# Patient Record
Sex: Male | Born: 2000 | Race: White | Hispanic: No | Marital: Single | Smoking: Current some day smoker
Health system: Southern US, Community
[De-identification: ages and names within clinical notes are randomized; demographics above are authoritative.]

## PROBLEM LIST (undated history)

## (undated) DIAGNOSIS — E669 Obesity, unspecified: Secondary | ICD-10-CM

---
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
## 2019-03-18 ENCOUNTER — Telehealth: Payer: Self-pay

## 2019-03-18 NOTE — Telephone Encounter (Signed)
Called patient to do their pre-visit COVID screening.  Number on file is for the [facility]. No DPR on file, unable to call mom for appt reminder.

## 2019-03-19 ENCOUNTER — Ambulatory Visit: Payer: Medicaid Other

## 2019-04-16 IMAGING — DX DG CHEST 2V
2 series · 2 of 2 positions shown · non-contrast
Comparison: None.

CLINICAL DATA: Cough, chest pain and shortness of breath for 3
days.

EXAM:
CHEST - 2 VIEW

[chest pa]
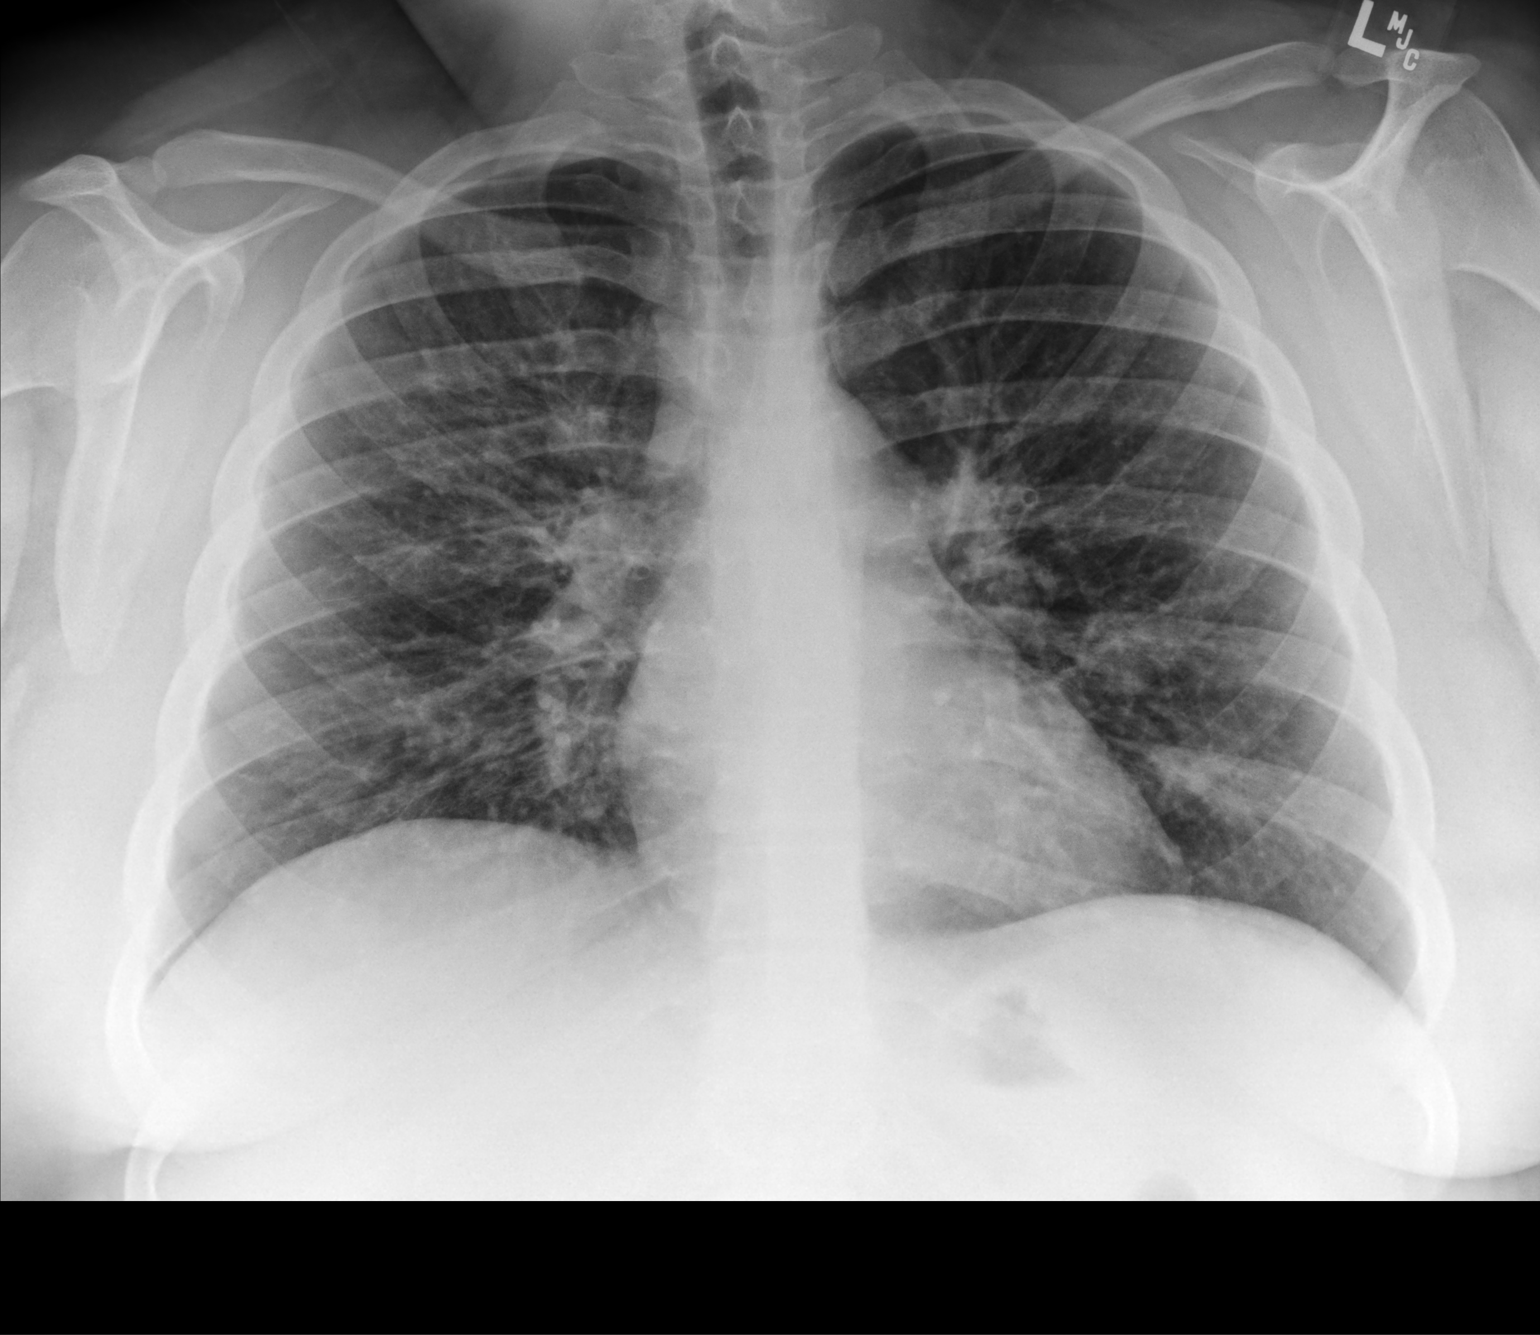

[chest lat]
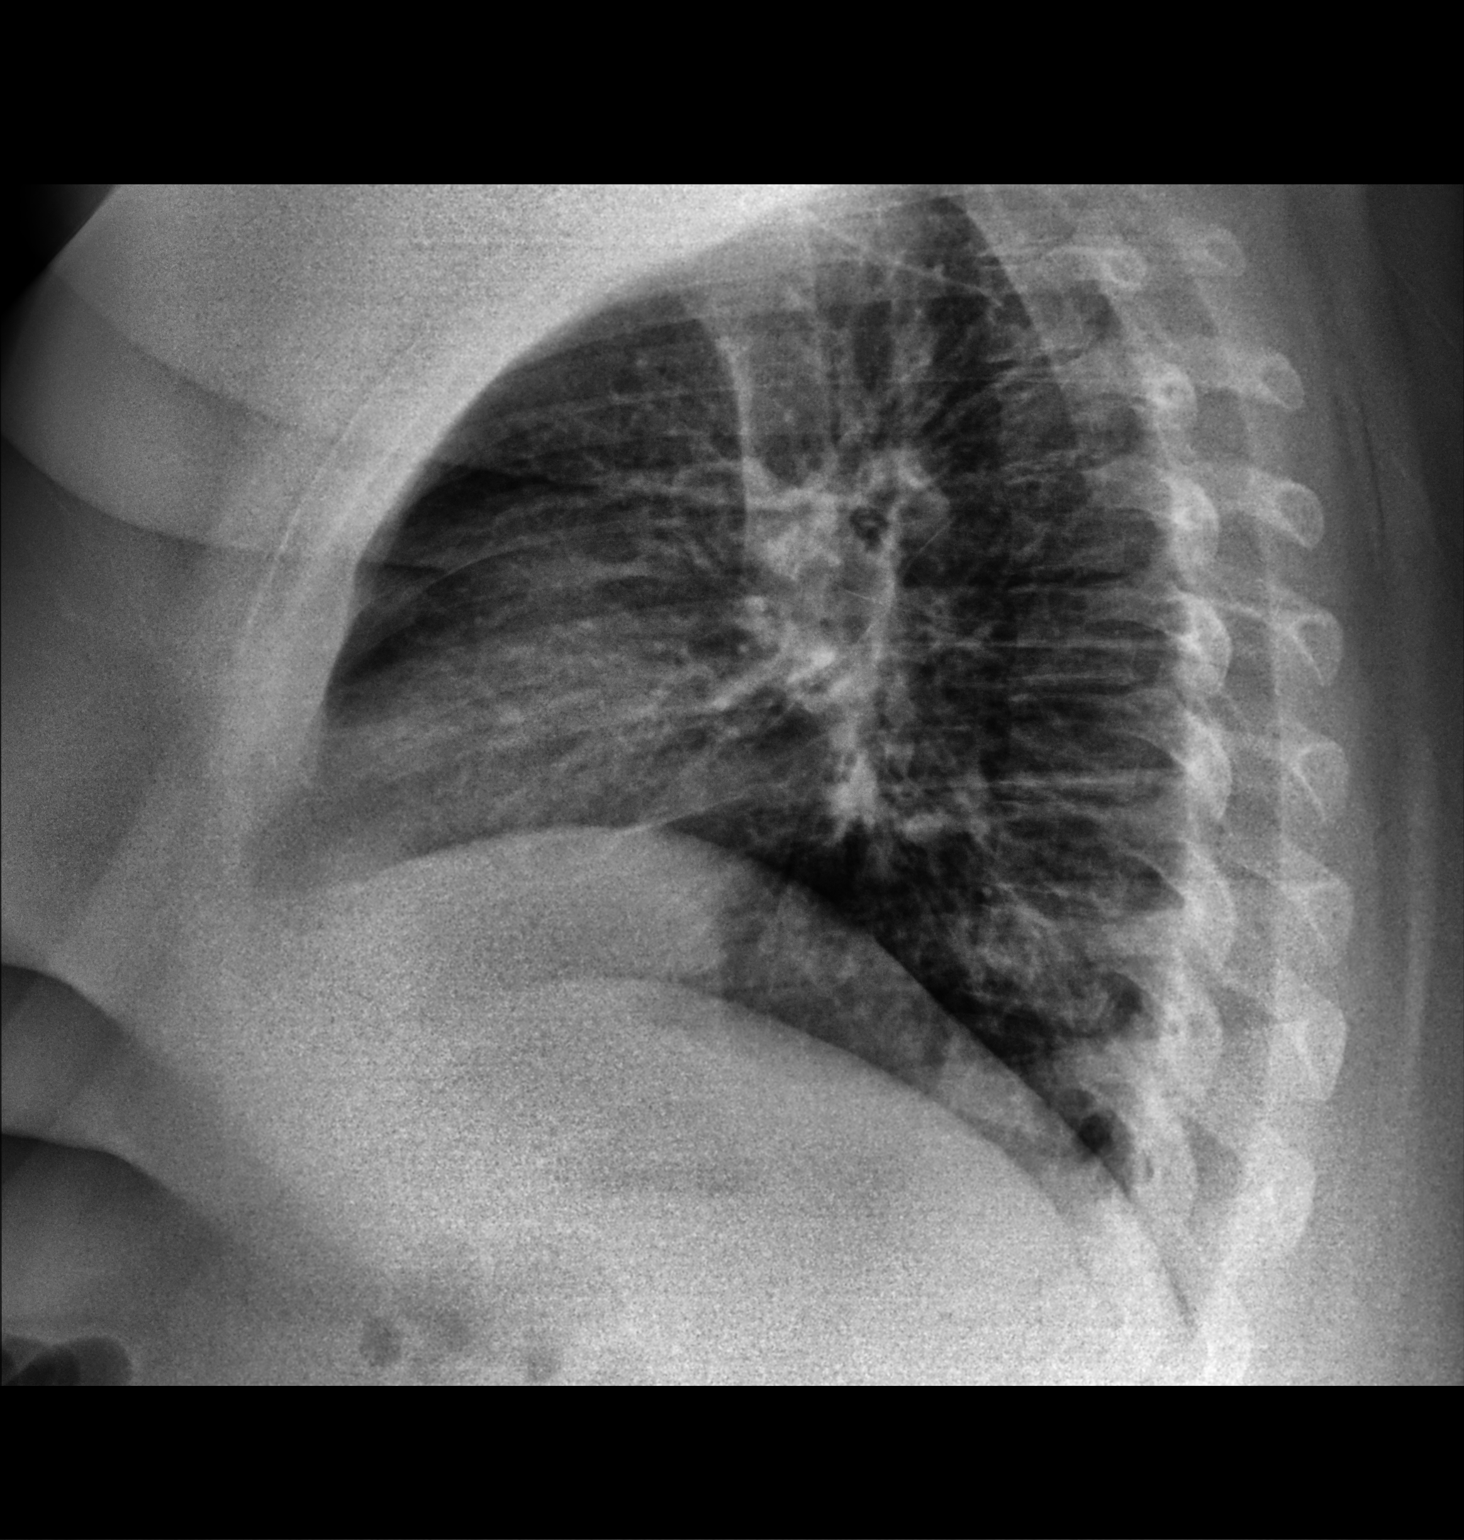

[2 of 2 positions shown; findings below may reference images not displayed]

FINDINGS: Peribronchial thickening is identified. No consolidative process,
pneumothorax or effusion. Heart size is normal. No acute or focal
bony abnormality.
IMPRESSION: Findings compatible with bronchitis.

## 2019-09-19 ENCOUNTER — Telehealth: Payer: Medicaid Other | Admitting: Internal Medicine

## 2020-05-05 ENCOUNTER — Telehealth: Payer: Medicaid Other | Admitting: Internal Medicine

## 2020-05-05 ENCOUNTER — Other Ambulatory Visit: Payer: Self-pay

## 2021-05-14 NOTE — Progress Notes (Signed)
Erroneous encounter

## 2021-05-18 ENCOUNTER — Encounter: Payer: Medicaid Other | Admitting: Family

## 2021-05-18 DIAGNOSIS — R7303 Prediabetes: Secondary | ICD-10-CM

## 2021-05-18 DIAGNOSIS — Z7689 Persons encountering health services in other specified circumstances: Secondary | ICD-10-CM

## 2021-06-07 ENCOUNTER — Ambulatory Visit (INDEPENDENT_AMBULATORY_CARE_PROVIDER_SITE_OTHER): Payer: Medicaid Other | Admitting: Nurse Practitioner

## 2021-06-07 ENCOUNTER — Encounter: Payer: Self-pay | Admitting: Nurse Practitioner

## 2021-06-07 ENCOUNTER — Other Ambulatory Visit: Payer: Self-pay

## 2021-06-07 VITALS — BP 164/95 | HR 95 | Temp 98.2°F | Ht 64.0 in | Wt >= 6400 oz

## 2021-06-07 DIAGNOSIS — L0211 Cutaneous abscess of neck: Secondary | ICD-10-CM | POA: Diagnosis not present

## 2021-06-07 MED ORDER — DOXYCYCLINE HYCLATE 100 MG PO TABS: 100.0000 mg | ORAL_TABLET | Freq: Two times a day (BID) | ORAL | 0 refills | Status: AC

## 2021-06-07 NOTE — Patient Instructions (Addendum)
1. Abscess, neck ? ?- CBC ?- Comprehensive metabolic panel ?- doxycycline (VIBRA-TABS) 100 MG tablet; Take 1 tablet (100 mg total) by mouth 2 (two) times daily for 10 days.  Dispense: 20 tablet; Refill: 0 ?- Ambulatory referral to Dermatology ? ?-defers physical at this time ? ?Elevated Blood pressure: ? ?Dash diet ? ?Follow up: ? ?Follow up if needed ? ?Mediterranean Diet ?A Mediterranean diet refers to food and lifestyle choices that are based on the traditions of countries located on the [employer] [place]. It focuses on eating more fruits, vegetables, whole grains, beans, nuts, seeds, and heart-healthy fats, and eating less dairy, meat, eggs, and processed foods with added sugar, salt, and fat. This way of eating has been shown to help prevent certain conditions and improve outcomes for people who have chronic diseases, like kidney disease and heart disease. ?What are tips for following this plan? ?Reading food labels ?Check the serving size of packaged foods. For foods such as rice and pasta, the serving size refers to the amount of cooked product, not dry. ?Check the total fat in packaged foods. Avoid foods that have saturated fat or trans fats. ?Check the ingredient list for added sugars, such as corn syrup. ?Shopping ? ?Buy a variety of foods that offer a balanced diet, including: ?Fresh fruits and vegetables (produce). ?Grains, beans, nuts, and seeds. Some of these may be available in unpackaged forms or large amounts (in bulk). ?Fresh seafood. ?Poultry and eggs. ?Low-fat dairy products. ?Buy whole ingredients instead of prepackaged foods. ?Buy fresh fruits and vegetables in-season from local farmers markets. ?Buy plain frozen fruits and vegetables. ?If you do not have access to quality fresh seafood, buy precooked frozen shrimp or canned fish, such as tuna, salmon, or sardines. ?Stock your pantry so you always have certain foods on hand, such as olive oil, canned tuna, canned tomatoes, rice, pasta, and  beans. ?Cooking ?Cook foods with extra-virgin olive oil instead of using butter or other vegetable oils. ?Have meat as a side dish, and have vegetables or grains as your main dish. This means having meat in small portions or adding small amounts of meat to foods like pasta or stew. ?Use beans or vegetables instead of meat in common dishes like chili or lasagna. ?Experiment with different cooking methods. Try roasting, broiling, steaming, and saut?ing vegetables. ?Add frozen vegetables to soups, stews, pasta, or rice. ?Add nuts or seeds for added healthy fats and plant protein at each meal. You can add these to yogurt, salads, or vegetable dishes. ?Marinate fish or vegetables using olive oil, lemon juice, garlic, and fresh herbs. ?Meal planning ?Plan to eat one vegetarian meal one day each week. Try to work up to two vegetarian meals, if possible. ?Eat seafood two or more times a week. ?Have healthy snacks readily available, such as: ?Vegetable sticks with hummus. ?[place] yogurt. ?Fruit and nut trail mix. ?Eat balanced meals throughout the week. This includes: ?Fruit: 2-3 servings a day. ?Vegetables: 4-5 servings a day. ?Low-fat dairy: 2 servings a day. ?Fish, poultry, or lean meat: 1 serving a day. ?Beans and legumes: 2 or more servings a week. ?Nuts and seeds: 1-2 servings a day. ?Whole grains: 6-8 servings a day. ?Extra-virgin olive oil: 3-4 servings a day. ?Limit red meat and sweets to only a few servings a month. ?Lifestyle ? ?Cook and eat meals together with your family, when possible. ?Drink enough fluid to keep your urine pale yellow. ?Be physically active every day. This includes: ?Aerobic exercise like running or swimming. ?  Leisure activities like gardening, walking, or housework. ?Get 7-8 hours of sleep each night. ?If recommended by your health care provider, drink red wine in moderation. This means 1 glass a day for nonpregnant women and 2 glasses a day for men. A glass of wine equals 5 oz (150 mL). ?What  foods should I eat? ?Fruits ?Apples. Apricots. Avocado. Berries. Bananas. Cherries. Dates. Figs. Grapes. Lemons. Melon. Oranges. Peaches. Plums. Pomegranate. ?Vegetables ?Artichokes. Beets. Broccoli. Cabbage. Carrots. Eggplant. Green beans. Chard. Kale. Spinach. Onions. Leeks. Peas. Squash. Tomatoes. Peppers. Radishes. ?Grains ?Whole-grain pasta. Brown rice. Bulgur wheat. Polenta. Couscous. Whole-wheat bread. [place] Cobbatmeal. Quinoa. ?Meats and other proteins ?Beans. Almonds. Sunflower seeds. Pine nuts. Peanuts. Cod. Salmon. Scallops. Shrimp. Tuna. Tilapia. Clams. Oysters. Eggs. Poultry without skin. ?Dairy ?Low-fat milk. Cheese. Greek yogurt. ?Fats and oils ?Extra-virgin olive oil. Avocado oil. Grapeseed oil. ?Beverages ?Water. Red wine. Herbal tea. ?Sweets and desserts ?Greek yogurt with honey. Baked apples. Poached pears. Trail mix. ?Seasonings and condiments ?Basil. Cilantro. Coriander. Cumin. Mint. Parsley. Sage. Rosemary. Tarragon. Garlic. Oregano. Thyme. Pepper. Balsamic vinegar. Tahini. Hummus. Tomato sauce. Olives. Mushrooms. ?The items listed above may not be a complete list of foods and beverages you can eat. Contact a dietitian for more information. ?What foods should I limit? ?This is a list of foods that should be eaten rarely or only on special occasions. ?Fruits ?Fruit canned in syrup. ?Vegetables ?Deep-fried potatoes (french fries). ?Grains ?Prepackaged pasta or rice dishes. Prepackaged cereal with added sugar. Prepackaged snacks with added sugar. ?Meats and other proteins ?Beef. Pork. Lamb. Poultry with skin. Hot dogs. Tomasa BlaseBacon. ?Dairy ?Ice cream. Sour cream. Whole milk. ?Fats and oils ?Butter. Canola oil. Vegetable oil. Beef fat (tallow). Lard. ?Beverages ?Juice. Sugar-sweetened soft drinks. Beer. Liquor and spirits. ?Sweets and desserts ?Cookies. Cakes. Pies. Candy. ?Seasonings and condiments ?Mayonnaise. Pre-made sauces and marinades. ?The items listed above may not be a complete list of foods and  beverages you should limit. Contact a dietitian for more information. ?Summary ?The Mediterranean diet includes both food and lifestyle choices. ?Eat a variety of fresh fruits and vegetables, beans, nuts, seeds, and whole grains. ?Limit the amount of red meat and sweets that you eat. ?If recommended by your health care provider, drink red wine in moderation. This means 1 glass a day for nonpregnant women and 2 glasses a day for men. A glass of wine equals 5 oz (150 mL). ?This information is not intended to replace advice given to you by your health care provider. Make sure you discuss any questions you have with your health care provider. ?Document Revised: 04/12/2019 Document Reviewed: 02/07/2019 ?Elsevier Patient Education ? 2022 Elsevier Inc. ? ? ?DASH Eating Plan ?DASH stands for Dietary Approaches to Stop Hypertension. The DASH eating plan is a healthy eating plan that has been shown to: ?Reduce high blood pressure (hypertension). ?Reduce your risk for type 2 diabetes, heart disease, and stroke. ?Help with weight loss. ?What are tips for following this plan? ?Reading food labels ?Check food labels for the amount of salt (sodium) per serving. Choose foods with less than 5 percent of the Daily Value of sodium. Generally, foods with less than 300 milligrams (mg) of sodium per serving fit into this eating plan. ?To find whole grains, look for the word "whole" as the first word in the ingredient list. ?Shopping ?Buy products labeled as "low-sodium" or "no salt added." ?Buy fresh foods. Avoid canned foods and pre-made or frozen meals. ?Cooking ?Avoid adding salt when cooking. Use salt-free seasonings or herbs instead of  table salt or sea salt. Check with your health care provider or pharmacist before using salt substitutes. ?Do not fry foods. Cook foods using healthy methods such as baking, boiling, grilling, roasting, and broiling instead. ?Cook with heart-healthy oils, such as olive, canola, avocado, soybean, or  sunflower oil. ?Meal planning ? ?Eat a balanced diet that includes: ?4 or more servings of fruits and 4 or more servings of vegetables each day. Try to fill one-half of your plate with fruits and vegetables. ?6-8

## 2021-06-07 NOTE — Progress Notes (Signed)
? ?@Patient  ID: Trevor Wilcox, male    DOB: [birth year], 21 y.o.   MRN: [id number] ? ?Chief Complaint  ?Patient presents with  ? New Patient (Initial Visit)  ?  Mass on back of neck for past 2 years. Has grown.   ? ? ?Referring provider: ?No ref. provider found ? ? ?HPI ? ?Patient presents today for raised area to the back of his neck.  He states that this has been going on for the past few weeks.  He has been having issues with these areas popping up today for parts of his body over the past 2 years.  He states that that.  Last visit started 2 weeks ago.  He has not noticed any drainage from this area.  He would like a referral to dermatology for further evaluation of these areas.  He states that the area is raised and tender to the touch. Denies f/c/s, n/v/d, hemoptysis, PND, chest pain or edema. ? ? ? ?Mass to neck getting larger ? ? ? ? ? ? ? ?No Known Allergies ? ? ?There is no immunization history on file for this patient. ? ?No past medical history on file. ? ?Tobacco History: ?Social History  ? ?Tobacco Use  ?Smoking Status Every Day  ?Smokeless Tobacco Never  ? ?Ready to quit: Not Answered ?Counseling given: Not Answered ? ? ?Outpatient Encounter Medications as of 06/07/2021  ?Medication Sig  ? doxycycline (VIBRA-TABS) 100 MG tablet Take 1 tablet (100 mg total) by mouth 2 (two) times daily for 10 days.  ? [DISCONTINUED] Melatonin 5 MG TABS Take 5-10 mg by mouth at bedtime as needed.  ? [DISCONTINUED] metFORMIN (GLUCOPHAGE) 500 MG tablet Take 1 tablet (500 mg total) by mouth 2 (two) times daily with a meal.  ? ?No facility-administered encounter medications on file as of 06/07/2021.  ? ? ? ?Review of Systems ? ?Review of Systems  ?Constitutional: Negative.   ?HENT: Negative.    ?Cardiovascular: Negative.   ?Gastrointestinal: Negative.   ?Skin:   ?     Raised tender area to the back of the neck  ?Allergic/Immunologic: Negative.   ?Neurological: Negative.   ?Psychiatric/Behavioral: Negative.     ? ? ? ?Physical  Exam ? ?BP (!) 164/95 (BP Location: Left Arm, Patient Position: Sitting, Cuff Size: Large)   Pulse 95   Temp 98.2 ?F (36.8 ?C) (Oral)   Ht 5\' 4"  (1.626 m)   Wt (!) 461 lb (209.1 kg)   SpO2 95%   BMI 79.13 kg/m?  ? ?Wt Readings from Last 5 Encounters:  ?06/07/21 (!) 461 lb (209.1 kg)  ?04/25/18 (!) 382 lb (173.3 kg) (>99 %, Z= 3.67)*  ?02/27/18 (!) 403 lb 12.8 oz (183.2 kg) (>99 %, Z= 3.79)*  ?02/20/18 (!) 404 lb 12.8 oz (183.6 kg) (>99 %, Z= 3.80)*  ?02/13/18 (!) 380 lb (172.4 kg) (>99 %, Z= 3.66)*  ? ?* Growth percentiles are based on CDC (Boys, 2-20 Years) data.  ? ? ? ?Physical Exam ?Vitals and nursing note reviewed.  ?Constitutional:   ?   General: He is not in acute distress. ?   Appearance: He is well-developed.  ?Cardiovascular:  ?   Rate and Rhythm: Normal rate and regular rhythm.  ?Pulmonary:  ?   Effort: Pulmonary effort is normal.  ?   Breath sounds: Normal breath sounds.  ?Skin: ?   General: Skin is warm and dry.  ?   Comments: Abscess noted to the left posterior side of neck. No drainage noted.  Tender to palpation.   ?Neurological:  ?   Mental Status: He is alert and oriented to person, place, and time.  ? ? ? ?Lab Results: ? ?CBC ?   ?Component Value Date/Time  ? WBC 14.2 (H) 06/07/2021 1525  ? RBC 5.40 06/07/2021 1525  ? HGB 15.7 06/07/2021 1525  ? HCT 47.5 06/07/2021 1525  ? PLT 302 06/07/2021 1525  ? MCV 88 06/07/2021 1525  ? MCH 29.1 06/07/2021 1525  ? MCHC 33.1 06/07/2021 1525  ? RDW 13.5 06/07/2021 1525  ? LYMPHSABS 3.6 (H) 02/20/2018 1110  ? EOSABS 0.4 02/20/2018 1110  ? BASOSABS 0.1 02/20/2018 1110  ? ? ?BMET ?   ?Component Value Date/Time  ? NA 141 06/07/2021 1525  ? K 4.7 06/07/2021 1525  ? CL 106 06/07/2021 1525  ? CO2 19 (L) 06/07/2021 1525  ? GLUCOSE 122 (H) 06/07/2021 1525  ? BUN 9 06/07/2021 1525  ? CREATININE 0.75 (L) 06/07/2021 1525  ? CALCIUM 9.5 06/07/2021 1525  ? GFRNONAA 145 02/20/2018 1110  ? GFRAA 167 02/20/2018 1110  ? ? ? ?Assessment & Plan:  ? ?Abscess, neck ?- CBC ?-  Comprehensive metabolic panel ?- doxycycline (VIBRA-TABS) 100 MG tablet; Take 1 tablet (100 mg total) by mouth 2 (two) times daily for 10 days.  Dispense: 20 tablet; Refill: 0 ?- Ambulatory referral to Dermatology ? ?-defers physical at this time ? ?Elevated Blood pressure: ? ?Dash diet ? ?Follow up: ? ?Follow up if needed ? ?Patient Instructions  ?1. Abscess, neck ? ?- CBC ?- Comprehensive metabolic panel ?- doxycycline (VIBRA-TABS) 100 MG tablet; Take 1 tablet (100 mg total) by mouth 2 (two) times daily for 10 days.  Dispense: 20 tablet; Refill: 0 ?- Ambulatory referral to Dermatology ? ?-defers physical at this time ? ?Elevated Blood pressure: ? ?Dash diet ? ?Follow up: ? ?Follow up if needed ? ?Mediterranean Diet ?A Mediterranean diet refers to food and lifestyle choices that are based on the traditions of countries located on the [place]. It focuses on eating more fruits, vegetables, whole grains, beans, nuts, seeds, and heart-healthy fats, and eating less dairy, meat, eggs, and processed foods with added sugar, salt, and fat. This way of eating has been shown to help prevent certain conditions and improve outcomes for people who have chronic diseases, like kidney disease and heart disease. ?What are tips for following this plan? ?Reading food labels ?Check the serving size of packaged foods. For foods such as rice and pasta, the serving size refers to the amount of cooked product, not dry. ?Check the total fat in packaged foods. Avoid foods that have saturated fat or trans fats. ?Check the ingredient list for added sugars, such as corn syrup. ?Shopping ? ?Buy a variety of foods that offer a balanced diet, including: ?Fresh fruits and vegetables (produce). ?Grains, beans, nuts, and seeds. Some of these may be available in unpackaged forms or large amounts (in bulk). ?Fresh seafood. ?Poultry and eggs. ?Low-fat dairy products. ?Buy whole ingredients instead of prepackaged foods. ?Buy fresh fruits and  vegetables in-season from local farmers markets. ?Buy plain frozen fruits and vegetables. ?If you do not have access to quality fresh seafood, buy precooked frozen shrimp or canned fish, such as tuna, salmon, or sardines. ?Stock your pantry so you always have certain foods on hand, such as olive oil, canned tuna, canned tomatoes, rice, pasta, and beans. ?Cooking ?Cook foods with extra-virgin olive oil instead of using butter or other vegetable oils. ?Have meat as a  side dish, and have vegetables or grains as your main dish. This means having meat in small portions or adding small amounts of meat to foods like pasta or stew. ?Use beans or vegetables instead of meat in common dishes like chili or lasagna. ?Experiment with different cooking methods. Try roasting, broiling, steaming, and saut?ing vegetables. ?Add frozen vegetables to soups, stews, pasta, or rice. ?Add nuts or seeds for added healthy fats and plant protein at each meal. You can add these to yogurt, salads, or vegetable dishes. ?Marinate fish or vegetables using olive oil, lemon juice, garlic, and fresh herbs. ?Meal planning ?Plan to eat one vegetarian meal one day each week. Try to work up to two vegetarian meals, if possible. ?Eat seafood two or more times a week. ?Have healthy snacks readily available, such as: ?Vegetable sticks with hummus. ?[place] yogurt. ?Fruit and nut trail mix. ?Eat balanced meals throughout the week. This includes: ?Fruit: 2-3 servings a day. ?Vegetables: 4-5 servings a day. ?Low-fat dairy: 2 servings a day. ?Fish, poultry, or lean meat: 1 serving a day. ?Beans and legumes: 2 or more servings a week. ?Nuts and seeds: 1-2 servings a day. ?Whole grains: 6-8 servings a day. ?Extra-virgin olive oil: 3-4 servings a day. ?Limit red meat and sweets to only a few servings a month. ?Lifestyle ? ?Cook and eat meals together with your family, when possible. ?Drink enough fluid to keep your urine pale yellow. ?Be physically active every day.  This includes: ?Aerobic exercise like running or swimming. ?Leisure activities like gardening, walking, or housework. ?Get 7-8 hours of sleep each night. ?If recommended by your health care provider, drink red win

## 2021-06-08 ENCOUNTER — Encounter: Payer: Self-pay | Admitting: Nurse Practitioner

## 2021-06-08 DIAGNOSIS — L0211 Cutaneous abscess of neck: Secondary | ICD-10-CM | POA: Insufficient documentation

## 2021-06-08 LAB — CBC
Hematocrit: 47.5 % (ref 37.5–51.0)
Hemoglobin: 15.7 g/dL (ref 13.0–17.7)
MCH: 29.1 pg (ref 26.6–33.0)
MCHC: 33.1 g/dL (ref 31.5–35.7)
MCV: 88 fL (ref 79–97)
Platelets: 302 10*3/uL (ref 150–450)
RBC: 5.4 x10E6/uL (ref 4.14–5.80)
RDW: 13.5 % (ref 11.6–15.4)
WBC: 14.2 10*3/uL — ABNORMAL HIGH (ref 3.4–10.8)

## 2021-06-08 LAB — COMPREHENSIVE METABOLIC PANEL
ALT: 30 IU/L (ref 0–44)
AST: 14 IU/L (ref 0–40)
Albumin/Globulin Ratio: 1.2 (ref 1.2–2.2)
Albumin: 4.3 g/dL (ref 4.1–5.2)
Alkaline Phosphatase: 104 IU/L (ref 44–121)
BUN/Creatinine Ratio: 12 (ref 9–20)
BUN: 9 mg/dL (ref 6–20)
Bilirubin Total: <0.2 mg/dL (ref 0.0–1.2)
CO2: 19 mmol/L — ABNORMAL LOW (ref 20–29)
Calcium: 9.5 mg/dL (ref 8.7–10.2)
Chloride: 106 mmol/L (ref 96–106)
Creatinine, Ser: 0.75 mg/dL — ABNORMAL LOW (ref 0.76–1.27)
Globulin, Total: 3.6 g/dL (ref 1.5–4.5)
Glucose: 122 mg/dL — ABNORMAL HIGH (ref 70–99)
Potassium: 4.7 mmol/L (ref 3.5–5.2)
Sodium: 141 mmol/L (ref 134–144)
Total Protein: 7.9 g/dL (ref 6.0–8.5)
eGFR: 132 mL/min/{1.73_m2} (ref >59)

## 2021-06-08 NOTE — Assessment & Plan Note (Signed)
-   CBC ?- Comprehensive metabolic panel ?- doxycycline (VIBRA-TABS) 100 MG tablet; Take 1 tablet (100 mg total) by mouth 2 (two) times daily for 10 days.  Dispense: 20 tablet; Refill: 0 ?- Ambulatory referral to Dermatology ? ?-defers physical at this time ? ?Elevated Blood pressure: ? ?Dash diet ? ?Follow up: ? ?Follow up if needed ?

## 2021-07-11 ENCOUNTER — Ambulatory Visit (HOSPITAL_COMMUNITY)
Admission: EM | Admit: 2021-07-11 | Discharge: 2021-07-11 | Disposition: A | Discharge status: Home / Self Care | Payer: Medicaid Other | Attending: Emergency Medicine | Admitting: Emergency Medicine

## 2021-07-11 ENCOUNTER — Ambulatory Visit (INDEPENDENT_AMBULATORY_CARE_PROVIDER_SITE_OTHER): Payer: Medicaid Other

## 2021-07-11 ENCOUNTER — Encounter (HOSPITAL_COMMUNITY): Payer: Self-pay | Admitting: *Deleted

## 2021-07-11 DIAGNOSIS — R0600 Dyspnea, unspecified: Secondary | ICD-10-CM

## 2021-07-11 DIAGNOSIS — R0602 Shortness of breath: Secondary | ICD-10-CM | POA: Diagnosis not present

## 2021-07-11 DIAGNOSIS — R0789 Other chest pain: Secondary | ICD-10-CM

## 2021-07-11 HISTORY — DX: Obesity, unspecified: E66.9

## 2021-07-11 MED ORDER — ALBUTEROL SULFATE HFA 108 (90 BASE) MCG/ACT IN AERS: 1.0000 | INHALATION_SPRAY | Freq: Four times a day (QID) | RESPIRATORY_TRACT | 0 refills | Status: AC | PRN

## 2021-07-11 NOTE — Discharge Instructions (Addendum)
Follow with PCP for further referral for CT scan ?ProAir was prescribed/take as directed ?Chest pain precautions given. ?Return or go to ER if you develop any new or worsening of your symptoms ?

## 2021-07-11 NOTE — ED Triage Notes (Signed)
C/O dyspnea x 4 days. States called EMS - eas told elevated BP, but told EMS he would go to urgent care. ?Upon further triaging pt, he then states he's been having constant mid-chest pain. ?States SOB and chest pain is worse when laying down. C/O feeling fatigue. Denies any cough or congestion. ?Also notes "a line of knots to right side of my neck"; started off as 1 knot 2 yrs ago, "but now there are more wrapping around". ?

## 2021-07-11 NOTE — ED Provider Notes (Signed)
?[place] ? ? ?[id number] ?07/11/21 Arrival Time: 6761 ? ? ?Chief Complaint  ?Patient presents with  ? Shortness of Breath  ? ? ?SUBJECTIVE: ? ?Trevor Wilcox is a 21 y.o. male who presented to the urgent care with a complaint of shortness of breath and chest pain for the past 4 days.  Denies a precipitating event, trauma, recent lower respiratory tract, or strenuous upper body activities.  Localizes chest pain to the substernal region with radiating pain to the right arm.  Describes pain as achy and reproducible when pushing on his chest. denies any alleviating or aggravating factors.  Denies similar symptoms in the past.  Denies fever, chills, lightheadedness, dizziness, palpitations, tachycardia, SOB, nausea, vomiting, abdominal pain, changes in bowel or bladder habits, diaphoresis, numbness/tingling in extremities, peripheral edema, or anxiety.   ? ? ?Denies close relatives with cardiac hx ? ?Previous cardiac testing: none. ? ?ROS: As per HPI.  All other pertinent ROS negative.    ?Past Medical History:  ?Diagnosis Date  ? Obesity   ? ?History reviewed. No pertinent surgical history. ?No Known Allergies ?No current facility-administered medications on file prior to encounter.  ? ?No current outpatient medications on file prior to encounter.  ? ?Social History  ? ?Socioeconomic History  ? Marital status: Single  ?  Spouse name: Not on file  ? Number of children: Not on file  ? Years of education: Not on file  ? Highest education level: Not on file  ?Occupational History  ? Not on file  ?Tobacco Use  ? Smoking status: Some Days  ?  Types: Cigarettes  ? Smokeless tobacco: Never  ?Vaping Use  ? Vaping Use: Never used  ?Substance and Sexual Activity  ? Alcohol use: Not Currently  ?  Comment: rarely  ? Drug use: Yes  ?  Types: Marijuana  ? Sexual activity: Not on file  ?Other Topics Concern  ? Not on file  ?Social History Narrative  ? Not on file  ? ?Social Determinants of Health  ? ?Financial Resource  Strain: Not on file  ?Food Insecurity: Not on file  ?Transportation Needs: Not on file  ?Physical Activity: Not on file  ?Stress: Not on file  ?Social Connections: Not on file  ?Intimate Partner Violence: Not on file  ? ?Family History  ?Problem Relation Age of Onset  ? Rheum arthritis Mother   ? Hypertension Mother   ? Obesity Mother   ? ? ? ?OBJECTIVE: ? ?Vitals:  ? 07/11/21 1529 07/11/21 1535  ?BP:  (!) 145/74  ?Pulse: 90   ?Resp:  20  ?Temp:  98.1 ?F (36.7 ?C)  ?TempSrc:  Oral  ?SpO2: 98%   ?  ?General appearance: alert; no distress ?Eyes: PERRLA; EOMI; conjunctiva normal ?HENT: normocephalic; atraumatic ?Neck: supple ?Lungs: clear to auscultation bilaterally without adventitious breath sounds ?Heart: regular rate and rhythm.  Clear S1 and S2 without rubs, gallops, or murmur. ?Chest Wall: Pain with palpation of chest wall,  no thrills ?Abdomen: soft, non-tender; bowel sounds normal; no masses or organomegaly; no guarding or rebound tenderness ?Extremities: no cyanosis or edema; symmetrical with no gross deformities ?Skin: warm and dry ?Psychological: alert and cooperative; normal mood and affect ? ?ECG: ?Orders placed or performed during the hospital encounter of 07/11/21  ? ED EKG  ? ED EKG  ? EKG 12-Lead  ? EKG 12-Lead  ? ? ?EKG normal sinus rhythm without ST elevations, depressions, or prolonged PR interval.  No narrowing or widening of the QRS complexes.  I have personally review the EKG. ? ?LABS:  ?Results for orders placed or performed in visit on 06/07/21  ?CBC  ?Result Value Ref Range  ? WBC 14.2 (H) 3.4 - 10.8 x10E3/uL  ? RBC 5.40 4.14 - 5.80 x10E6/uL  ? Hemoglobin 15.7 13.0 - 17.7 g/dL  ? Hematocrit 47.5 37.5 - 51.0 %  ? MCV 88 79 - 97 fL  ? MCH 29.1 26.6 - 33.0 pg  ? MCHC 33.1 31.5 - 35.7 g/dL  ? RDW 13.5 11.6 - 15.4 %  ? Platelets 302 150 - 450 x10E3/uL  ?Comprehensive metabolic panel  ?Result Value Ref Range  ? Glucose 122 (H) 70 - 99 mg/dL  ? BUN 9 6 - 20 mg/dL  ? Creatinine, Ser 0.75 (L) 0.76 -  1.27 mg/dL  ? eGFR 132 >59 mL/min/1.73  ? BUN/Creatinine Ratio 12 9 - 20  ? Sodium 141 134 - 144 mmol/L  ? Potassium 4.7 3.5 - 5.2 mmol/L  ? Chloride 106 96 - 106 mmol/L  ? CO2 19 (L) 20 - 29 mmol/L  ? Calcium 9.5 8.7 - 10.2 mg/dL  ? Total Protein 7.9 6.0 - 8.5 g/dL  ? Albumin 4.3 4.1 - 5.2 g/dL  ? Globulin, Total 3.6 1.5 - 4.5 g/dL  ? Albumin/Globulin Ratio 1.2 1.2 - 2.2  ? Bilirubin Total <0.2 0.0 - 1.2 mg/dL  ? Alkaline Phosphatase 104 44 - 121 IU/L  ? AST 14 0 - 40 IU/L  ? ALT 30 0 - 44 IU/L  ? ?Labs Reviewed - No data to display ? ?DIAGNOSTIC STUDIES:  ? ?DG Chest 2 View ? ?Result Date: 07/11/2021 ?CLINICAL DATA:  Dyspnea for 4 days. EXAM: CHEST - 2 VIEW COMPARISON:  May 16, 2018 FINDINGS: The heart size is normal. There is questioned masslike opacity projected over the right hilum. The left hilum is normal. Both lungs are clear. The visualized skeletal structures are unremarkable. IMPRESSION: Question masslike opacity projected over the right hilum. Further evaluation with chest CT is recommended. Electronically Signed   By: Abelardo Diesel M.D.   On: 07/11/2021 16:45    ? ?Chest x-ray is questionable for masslike opacity.  Further evaluation with CT is recommended.  I have reviewed the x-ray myself and the radiologist interpretation.  I am in agreement with the radiologist interpretation. ? ? ?ASSESSMENT & PLAN: ? ?1. SOB (shortness of breath)   ?2. Chest wall pain   ? ? ?Meds ordered this encounter  ?Medications  ? albuterol (VENTOLIN HFA) 108 (90 Base) MCG/ACT inhaler  ?  Sig: Inhale 1-2 puffs into the lungs every 6 (six) hours as needed for wheezing or shortness of breath.  ?  Dispense:  18 g  ?  Refill:  0  ? ? ?Patient history and exam consistent with non-cardiac cause of chest pain. ?Conservative measures indicated. ? ?Discharge instructions ? ?Follow with PCP for further referral for CT scan ?ProAir was prescribed/take as directed ?Chest pain precautions given. ?Return or go to ER if you develop any  new or worsening of your symptoms ? ?Reviewed expectations re: course of current medical issues. Questions answered. ?Outlined signs and symptoms indicating need for more acute intervention. ?Patient verbalized understanding. ?After Visit Summary given.  ?  ?Emerson Monte, FNP ?07/11/21 1714 ? ?

## 2021-07-13 ENCOUNTER — Other Ambulatory Visit: Payer: Self-pay

## 2021-07-13 ENCOUNTER — Ambulatory Visit: Payer: Self-pay

## 2021-07-13 ENCOUNTER — Emergency Department (HOSPITAL_COMMUNITY): Payer: Medicaid Other

## 2021-07-13 ENCOUNTER — Telehealth: Payer: Self-pay | Admitting: Nurse Practitioner

## 2021-07-13 ENCOUNTER — Encounter (HOSPITAL_COMMUNITY): Payer: Self-pay | Admitting: Emergency Medicine

## 2021-07-13 ENCOUNTER — Emergency Department (HOSPITAL_COMMUNITY)
Admission: EM | Admit: 2021-07-13 | Discharge: 2021-07-13 | Disposition: A | Discharge status: Home / Self Care | Payer: Medicaid Other | Attending: Emergency Medicine | Admitting: Emergency Medicine

## 2021-07-13 DIAGNOSIS — R0602 Shortness of breath: Secondary | ICD-10-CM | POA: Insufficient documentation

## 2021-07-13 DIAGNOSIS — R079 Chest pain, unspecified: Secondary | ICD-10-CM | POA: Diagnosis not present

## 2021-07-13 DIAGNOSIS — R918 Other nonspecific abnormal finding of lung field: Secondary | ICD-10-CM | POA: Diagnosis not present

## 2021-07-13 LAB — CBC WITH DIFFERENTIAL/PLATELET
Abs Immature Granulocytes: 0.05 10*3/uL (ref 0.00–0.07)
Basophils Absolute: 0 10*3/uL (ref 0.0–0.1)
Basophils Relative: 0 %
Eosinophils Absolute: 0.4 10*3/uL (ref 0.0–0.5)
Eosinophils Relative: 3 %
HCT: 43.5 % (ref 39.0–52.0)
Hemoglobin: 14.2 g/dL (ref 13.0–17.0)
Immature Granulocytes: 0 %
Lymphocytes Relative: 28 %
Lymphs Abs: 4.2 10*3/uL — ABNORMAL HIGH (ref 0.7–4.0)
MCH: 29.3 pg (ref 26.0–34.0)
MCHC: 32.6 g/dL (ref 30.0–36.0)
MCV: 89.7 fL (ref 80.0–100.0)
Monocytes Absolute: 1 10*3/uL (ref 0.1–1.0)
Monocytes Relative: 7 %
Neutro Abs: 9.3 10*3/uL — ABNORMAL HIGH (ref 1.7–7.7)
Neutrophils Relative %: 62 %
Platelets: 275 10*3/uL (ref 150–400)
RBC: 4.85 MIL/uL (ref 4.22–5.81)
RDW: 14.1 % (ref 11.5–15.5)
WBC: 15 10*3/uL — ABNORMAL HIGH (ref 4.0–10.5)
nRBC: 0 % (ref 0.0–0.2)

## 2021-07-13 LAB — BASIC METABOLIC PANEL
Anion gap: 6 (ref 5–15)
BUN: 11 mg/dL (ref 6–20)
CO2: 25 mmol/L (ref 22–32)
Calcium: 9.2 mg/dL (ref 8.9–10.3)
Chloride: 106 mmol/L (ref 98–111)
Creatinine, Ser: 0.73 mg/dL (ref 0.61–1.24)
GFR, Estimated: >60 mL/min (ref >60)
Glucose, Bld: 93 mg/dL (ref 70–99)
Potassium: 3.9 mmol/L (ref 3.5–5.1)
Sodium: 137 mmol/L (ref 135–145)

## 2021-07-13 LAB — BRAIN NATRIURETIC PEPTIDE: B Natriuretic Peptide: 31.7 pg/mL (ref 0.0–100.0)

## 2021-07-13 LAB — TROPONIN I (HIGH SENSITIVITY): Troponin I (High Sensitivity): 4 ng/L (ref <18)

## 2021-07-13 MED ORDER — IOHEXOL 350 MG/ML SOLN
100.0000 mL | Freq: Once | INTRAVENOUS | Status: AC | PRN
  Administered 2021-07-13: 100 mL via INTRAVENOUS

## 2021-07-13 NOTE — ED Notes (Signed)
Pt transported to CT ?

## 2021-07-13 NOTE — ED Notes (Signed)
Pt ambulatory to restroom

## 2021-07-13 NOTE — ED Provider Notes (Signed)
?[facility] ?Provider Note ? ? ?CSN: [id number] ?Arrival date & time: 07/13/21  1425 ? ?  ? ?History ? ?Chief Complaint  ?Patient presents with  ? Shortness of Breath  ? ? ?Algis GreenhouseOmar Wilcox is a 21 y.o. male. ? ?Patient is a 21 year old male who presents with shortness of breath.  He has a history of obesity.  He states for the last 5 days he has had some shortness of breath.  He says it is worse when he lays flat but he also has some shortness of breath during the day.  No cough or cold symptoms.  No fevers.  He does have some pain to the center of his chest and to the right side.  Its not worse with cough or breathing.  No increased leg swelling.  No history of similar symptoms in the past.  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  He was seen in urgent care 2 days ago for the symptoms and started on albuterol inhaler which has not seemed to be helping.  He also had a chest x-ray at that time which showed a questionable mass in the right hilum.  He is very anxious about that and came to the emergency room to have it evaluated further. ? ? ?  ? ?Home Medications ?Prior to Admission medications   ?Medication Sig Start Date End Date Taking? Authorizing Provider  ?albuterol (VENTOLIN HFA) 108 (90 Base) MCG/ACT inhaler Inhale 1-2 puffs into the lungs every 6 (six) hours as needed for wheezing or shortness of breath. 07/11/21  Yes Avegno, Zachery DakinsKomlanvi S, FNP  ?   ? ?Allergies    ?Patient has no known allergies.   ? ?Review of Systems   ?Review of Systems  ?Constitutional:  Negative for chills, diaphoresis, fatigue and fever.  ?HENT:  Negative for congestion, rhinorrhea and sneezing.   ?Eyes: Negative.   ?Respiratory:  Positive for shortness of breath. Negative for cough and chest tightness.   ?Cardiovascular:  Positive for chest pain. Negative for leg swelling.  ?Gastrointestinal:  Negative for abdominal pain, blood in stool, diarrhea, nausea and vomiting.   ?Genitourinary:  Negative for difficulty urinating, flank pain, frequency and hematuria.  ?Musculoskeletal:  Negative for arthralgias and back pain.  ?Skin:  Negative for rash.  ?Neurological:  Negative for dizziness, speech difficulty, weakness, numbness and headaches.  ? ?Physical Exam ?Updated Vital Signs ?BP 133/71   Pulse 72   Temp 97.6 ?F (36.4 ?C) (Oral)   Resp 14   Ht 5\' 6"  (1.676 m)   Wt (!) 195 kg   SpO2 96%   BMI 69.40 kg/m?  ?Physical Exam ?Constitutional:   ?   Appearance: He is well-developed. He is obese.  ?HENT:  ?   Head: Normocephalic and atraumatic.  ?Eyes:  ?   Pupils: Pupils are equal, round, and reactive to light.  ?Cardiovascular:  ?   Rate and Rhythm: Normal rate and regular rhythm.  ?   Heart sounds: Normal heart sounds.  ?Pulmonary:  ?   Effort: Pulmonary effort is normal. No respiratory distress.  ?   Breath sounds: Normal breath sounds. No wheezing or rales.  ?Chest:  ?   Chest wall: No tenderness.  ?Abdominal:  ?   General: Bowel sounds are normal.  ?   Palpations: Abdomen is soft.  ?   Tenderness: There is no abdominal tenderness. There is no guarding or rebound.  ?Musculoskeletal:     ?  General: Normal range of motion.  ?   Cervical back: Normal range of motion and neck supple.  ?   Right lower leg: No edema.  ?   Left lower leg: No edema.  ?Lymphadenopathy:  ?   Cervical: No cervical adenopathy.  ?Skin: ?   General: Skin is warm and dry.  ?   Findings: No rash.  ?Neurological:  ?   Mental Status: He is alert and oriented to person, place, and time.  ? ? ?ED Results / Procedures / Treatments   ?Labs ?(all labs ordered are listed, but only abnormal results are displayed) ?Labs Reviewed  ?CBC WITH DIFFERENTIAL/PLATELET - Abnormal; Notable for the following components:  ?    Result Value  ? WBC 15.0 (*)   ? Neutro Abs 9.3 (*)   ? Lymphs Abs 4.2 (*)   ? All other components within normal limits  ?BASIC METABOLIC PANEL  ?BRAIN NATRIURETIC PEPTIDE  ?TROPONIN I (HIGH SENSITIVITY)   ? ? ?EKG ?EKG Interpretation ? ?Date/Time:  Tuesday July 13 2021 16:17:47 EDT ?Ventricular Rate:  90 ?PR Interval:  162 ?QRS Duration: 114 ?QT Interval:  352 ?QTC Calculation: 431 ?R Axis:   9 ?Text Interpretation: Sinus rhythm Borderline intraventricular conduction delay since last tracing no significant change Confirmed by Rolan Bucco [phone]) on 07/13/2021 4:19:48 PM ? ?Radiology ?CT Angio Chest PE W/Cm &/Or Wo Cm ? ?Result Date: 07/13/2021 ?CLINICAL DATA:  Pulmonary embolism suspected, high probability. Shortness of breath, possible right hilar mass. EXAM: CT ANGIOGRAPHY CHEST WITH CONTRAST TECHNIQUE: Multidetector CT imaging of the chest was performed using the standard protocol during bolus administration of intravenous contrast. Multiplanar CT image reconstructions and MIPs were obtained to evaluate the vascular anatomy. RADIATION DOSE REDUCTION: This exam was performed according to the departmental dose-optimization program which includes automated exposure control, adjustment of the mA and/or kV according to patient size and/or use of iterative reconstruction technique. CONTRAST:  OMNIPAQUE IOHEXOL 350 MG/ML SOLN COMPARISON:  11/10/2021. FINDINGS: Cardiovascular: The heart is normal in size and there is no pericardial effusion. The aorta and pulmonary trunk are normal in caliber. No large central pulmonary artery filling defect is identified. Evaluation of the segmental and subsegmental arteries is limited due to suboptimal opacification and respiratory motion artifact. Mediastinum/Nodes: A few prominent lymph nodes are seen in the mediastinum measuring up to 1.5 cm in short axis diameter in the right paratracheal space. There is a mass like lesion at the right hilum measuring 3.9 x 3.2 cm. No hilar lymph adenopathy on the left. No axillary lymphadenopathy. The thyroid gland, trachea, and esophagus are within normal limits. Lungs/Pleura: Lungs are clear. No pleural effusion or pneumothorax. Upper  Abdomen: There is fatty infiltration of the liver. The spleen is enlarged measuring 14.1 cm. Musculoskeletal: Gynecomastia is present bilaterally. No acute or suspicious osseous abnormality. Review of the MIP images confirms the above findings. IMPRESSION: 1. No large central pulmonary artery filling defect. Evaluation of the segmental and subsegmental arteries is limited due to suboptimal opacification and respiratory motion artifact. If clinical concern for pulmonary embolism persists, short-term repeat evaluation or V/Q scan is suggested. 2. No acute process in the chest. 3. Masslike lesion at the right hilum measuring 3.9 x 3.2 cm. Differential diagnosis includes conglomeration of enlarged lymph nodes versus other benign or malignant process. PET-CT or bronchoscopy with biopsy may be beneficial for further evaluation. 4. Hepatomegaly. 5. Mild splenomegaly. Electronically Signed   By: Thornell Sartorius M.D.   On: 07/13/2021 20:51   ? ?  Procedures ?Procedures  ? ? ?Medications Ordered in ED ?Medications  ?iohexol (OMNIPAQUE) 350 MG/ML injection 100 mL (100 mLs Intravenous Contrast Given 07/13/21 2031)  ? ? ?ED Course/ Medical Decision Making/ A&P ?  ?                        ?Medical Decision Making ?Amount and/or Complexity of Data Reviewed ?External Data Reviewed: notes. ?Labs: ordered. Decision-making details documented in ED Course. ?Radiology: ordered. Decision-making details documented in ED Course. ?ECG/medicine tests: ordered and independent interpretation performed. Decision-making details documented in ED Course. ? ?Risk ?Decision regarding hospitalization. ? ? ?Patient is a 21 year old male who presents with shortness of breath.  He is morbidly obese.  He is not febrile.  No suggestions of pneumonia.  He had a recent x-ray at urgent care which showed a right lung mass.  CT scan was performed today which shows no evidence of PE.  There is a right lung mass that needs further outpatient follow-up.  His BMP is  normal without any suggestions of pulmonary edema.  He has no hypoxia.  No tachycardia.  No increased work of breathing.  He is otherwise well-appearing.  No chest pain or other symptoms that would be more suggestive of ACS.

## 2021-07-13 NOTE — Telephone Encounter (Signed)
Pt's mother called in for pt. She is not listed on pt's DPR so limited information was given. She states that EMS was called out for pt the other morning and pt ended up going to UC for SOB. UC did CXR and recommended CT be done d/t mass over hylum. Pt's mother asking what pt should do and if CT can be done in office and saying that pt is still SOB and lethargic. I advised mother that if pt still experiencing those symptoms would need to go to ED to be evaluated and they can do additional testing there if needed. She asked if CT can be done in the office and I advised her no that either has to be done in the hospital or outpatient imaging that is scheduled. She verbalized understanding and states she would tell pt he needed to go to ED.  ?

## 2021-07-13 NOTE — ED Triage Notes (Signed)
Patient states he went to UC on 4/23 and was told to come here for CT scan. Reports dyspnea x 5 days. Had X-ray at [facility] that impression says "Question mass like opacity projected over the right hilum." Was given inhaler and states symptoms have improved.  ?

## 2021-07-13 NOTE — ED Notes (Signed)
Got patient into a gown on the monitor patient is resting with call bell in reach 

## 2021-07-13 NOTE — Discharge Instructions (Signed)
Make an appointment within the next few days to follow-up with your primary care doctor.  You need to have further testing done on the abnormal looking area in your lung.  Return to the emergency room if you have any worsening symptoms or increased shortness of breath. ?

## 2021-07-13 NOTE — ED Provider Triage Note (Signed)
Emergency Medicine Provider Triage Evaluation Note ? ?Trevor Wilcox , a 21 y.o. male  was evaluated in triage.  Pt complains of shortness of breath for the last 5 days.  Patient was seen in urgent care on 4/23, had chest x-ray done which showed "masslike opacity projected over the right hilum.  Further evaluation with CT chest recommended".  Patient states that since being seen at urgent care, he was provided with inhaler which did seem to alleviate some of his shortness of breath.  Patient states that he is "a hypochondriac so I am kind of nervous".  Patient reports increased shortness of breath beginning last night.  Also endorsing chest pain. ? ?Review of Systems  ?Positive:  ?Negative:  ? ?Physical Exam  ?BP (!) 181/89   Pulse 98   Temp 98.8 ?F (37.1 ?C) (Oral)   Resp (!) 22   Ht 5\' 6"  (1.676 m)   Wt (!) 195 kg   SpO2 100%   BMI 69.40 kg/m?  ?Gen:   Awake, no distress   ?Resp:  Normal effort  ?MSK:   Moves extremities without difficulty  ?Other:   ? ?Medical Decision Making  ?Medically screening exam initiated at 2:53 PM.  Appropriate orders placed.  Elan Brainerd was informed that the remainder of the evaluation will be completed by another provider, this initial triage assessment does not replace that evaluation, and the importance of remaining in the ED until their evaluation is complete. ? ? ?  ?Trevor Greenhouse, PA-C ?07/13/21 1454 ? ?

## 2021-07-13 NOTE — ED Notes (Signed)
Pt brought back from CT - IV apparently infiltrated but previous RN not made aware of this. CT called to verify that pt did not receive exam and asked to let RN know next time something like this happens  ?

## 2021-07-13 NOTE — ED Notes (Signed)
Patient verbalizes understanding of discharge instructions. Opportunity for questioning and answers were provided. Armband removed by staff, pt discharged from ED ambulatory.   

## 2021-07-13 NOTE — Telephone Encounter (Signed)
Copied from [place] [id number]. Topic: Quick Communication - See Telephone Encounter ?>> Jul 13, 2021  9:45 AM Loma [place] wrote: ?CRM for notification. See Telephone encounter for: 07/13/21.Pt has not been given abnormal lab results and no MyChart or CRM to disclose, pls FU with PT [phone] ?

## 2021-07-14 ENCOUNTER — Other Ambulatory Visit: Payer: Self-pay | Admitting: Nurse Practitioner

## 2021-07-14 ENCOUNTER — Telehealth: Payer: Self-pay

## 2021-07-14 DIAGNOSIS — R918 Other nonspecific abnormal finding of lung field: Secondary | ICD-10-CM

## 2021-07-14 NOTE — Telephone Encounter (Signed)
Please call patient to let him know that I am placing stat referral to pulmonary for further evaluation of chest CT yesterday. Please call [facility] to see how quick they can get him scheduled. Thanks.  ?

## 2021-07-14 NOTE — Telephone Encounter (Signed)
I have documented this within the referral. ?

## 2021-07-15 ENCOUNTER — Telehealth: Payer: Self-pay

## 2021-07-15 NOTE — Telephone Encounter (Signed)
I called patient per provider to let him know that state referral to pulmonary for further evaluation of chest CT yesterday.  no answer left message to call office ?

## 2021-07-30 NOTE — Telephone Encounter (Signed)
No additional notes needed  

## 2022-02-14 NOTE — Progress Notes (Unsigned)
Patient ID: Trevor Wilcox, male    DOB: 02-14-2001  MRN: [id number]  CC: Annual Physical Exam  Subjective: Trevor Wilcox is a 21 y.o. male who presents for annual physical exam.  His concerns today include:   referral for dentist, weight issues, physical  Patient Active Problem List   Diagnosis Date Noted   Abscess, neck 06/08/2021   Prediabetes 03/01/2018   Folliculitis 02/20/2018   BMI 60.0-69.9, adult (HCC) 02/20/2018     Current Outpatient Medications on File Prior to Visit  Medication Sig Dispense Refill   albuterol (VENTOLIN HFA) 108 (90 Base) MCG/ACT inhaler Inhale 1-2 puffs into the lungs every 6 (six) hours as needed for wheezing or shortness of breath. 18 g 0   No current facility-administered medications on file prior to visit.    No Known Allergies  Social History   Socioeconomic History   Marital status: Single    Spouse name: Not on file   Number of children: Not on file   Years of education: Not on file   Highest education level: Not on file  Occupational History   Not on file  Tobacco Use   Smoking status: Some Days    Types: Cigarettes   Smokeless tobacco: Never  Vaping Use   Vaping Use: Never used  Substance and Sexual Activity   Alcohol use: Not Currently    Comment: rarely   Drug use: Yes    Types: Marijuana   Sexual activity: Not on file  Other Topics Concern   Not on file  Social History Narrative   Not on file   Social Determinants of Health   Financial Resource Strain: Not on file  Food Insecurity: Not on file  Transportation Needs: Not on file  Physical Activity: Not on file  Stress: Not on file  Social Connections: Not on file  Intimate Partner Violence: Not on file    Family History  Problem Relation Age of Onset   Rheum arthritis Mother    Hypertension Mother    Obesity Mother     No past surgical history on file.  ROS: Review of Systems Negative except as stated above  PHYSICAL EXAM: There were no vitals  taken for this visit.  Physical Exam  {male adult master:310786} {male adult master:310785}     Latest Ref Rng & Units 07/13/2021    2:55 PM 06/07/2021    3:25 PM 02/20/2018   11:10 AM  CMP  Glucose 70 - 99 mg/dL 93  409  811   BUN 6 - 20 mg/dL 11  9  9    Creatinine 0.61 - 1.24 mg/dL 9.14  7.82  9.56   Sodium 135 - 145 mmol/L 137  141  139   Potassium 3.5 - 5.1 mmol/L 3.9  4.7  4.5   Chloride 98 - 111 mmol/L 106  106  101   CO2 22 - 32 mmol/L 25  19  22    Calcium 8.9 - 10.3 mg/dL 9.2  9.5  9.6   Total Protein 6.0 - 8.5 g/dL  7.9  7.6   Total Bilirubin 0.0 - 1.2 mg/dL  <2.1  0.3   Alkaline Phos 44 - 121 IU/L  104  97   AST 0 - 40 IU/L  14  16   ALT 0 - 44 IU/L  30  23    Lipid Panel     Component Value Date/Time   CHOL 155 02/20/2018 1110   TRIG 57 02/20/2018 1110   HDL  42 02/20/2018 1110   CHOLHDL 3.7 02/20/2018 1110   LDLCALC 102 02/20/2018 1110    CBC    Component Value Date/Time   WBC 15.0 (H) 07/13/2021 1455   RBC 4.85 07/13/2021 1455   HGB 14.2 07/13/2021 1455   HGB 15.7 06/07/2021 1525   HCT 43.5 07/13/2021 1455   HCT 47.5 06/07/2021 1525   PLT 275 07/13/2021 1455   PLT 302 06/07/2021 1525   MCV 89.7 07/13/2021 1455   MCV 88 06/07/2021 1525   MCH 29.3 07/13/2021 1455   MCHC 32.6 07/13/2021 1455   RDW 14.1 07/13/2021 1455   RDW 13.5 06/07/2021 1525   LYMPHSABS 4.2 (H) 07/13/2021 1455   LYMPHSABS 3.6 (H) 02/20/2018 1110   MONOABS 1.0 07/13/2021 1455   EOSABS 0.4 07/13/2021 1455   EOSABS 0.4 02/20/2018 1110   BASOSABS 0.0 07/13/2021 1455   BASOSABS 0.1 02/20/2018 1110    ASSESSMENT AND PLAN:  There are no diagnoses linked to this encounter.   Patient was given the opportunity to ask questions.  Patient verbalized understanding of the plan and was able to repeat key elements of the plan. Patient was given clear instructions to go to Emergency Department or return to medical center if symptoms don't improve, worsen, or new problems develop.The  patient verbalized understanding.   No orders of the defined types were placed in this encounter.    Requested Prescriptions    No prescriptions requested or ordered in this encounter    No follow-ups on file.  Rema Fendt, NP

## 2022-02-16 ENCOUNTER — Encounter: Payer: Medicaid Other | Admitting: Family

## 2022-02-16 DIAGNOSIS — Z1329 Encounter for screening for other suspected endocrine disorder: Secondary | ICD-10-CM

## 2022-02-16 DIAGNOSIS — Z13220 Encounter for screening for lipoid disorders: Secondary | ICD-10-CM

## 2022-02-16 DIAGNOSIS — Z114 Encounter for screening for human immunodeficiency virus [HIV]: Secondary | ICD-10-CM

## 2022-02-16 DIAGNOSIS — Z130 Encounter for screening for diseases of the blood and blood-forming organs and certain disorders involving the immune mechanism: Secondary | ICD-10-CM

## 2022-02-16 DIAGNOSIS — Z13228 Encounter for screening for other metabolic disorders: Secondary | ICD-10-CM

## 2022-02-16 DIAGNOSIS — Z1159 Encounter for screening for other viral diseases: Secondary | ICD-10-CM

## 2022-02-16 DIAGNOSIS — R7303 Prediabetes: Secondary | ICD-10-CM

## 2022-02-16 DIAGNOSIS — Z0000 Encounter for general adult medical examination without abnormal findings: Secondary | ICD-10-CM

## 2022-03-04 NOTE — Progress Notes (Unsigned)
Patient ID: Trevor Wilcox, male    DOB: 10/02/2000  MRN: [id number]  CC: Annual Physical Exam  Subjective: Trevor Wilcox is a 21 y.o. male who presents for annual physical exam.   His concerns today include:   referral for dentist, weight issues, physical  Patient Active Problem List   Diagnosis Date Noted   Abscess, neck 06/08/2021   Prediabetes 03/01/2018   Folliculitis 02/20/2018   BMI 60.0-69.9, adult (HCC) 02/20/2018     Current Outpatient Medications on File Prior to Visit  Medication Sig Dispense Refill   albuterol (VENTOLIN HFA) 108 (90 Base) MCG/ACT inhaler Inhale 1-2 puffs into the lungs every 6 (six) hours as needed for wheezing or shortness of breath. 18 g 0   No current facility-administered medications on file prior to visit.    No Known Allergies  Social History   Socioeconomic History   Marital status: Single    Spouse name: Not on file   Number of children: Not on file   Years of education: Not on file   Highest education level: Not on file  Occupational History   Not on file  Tobacco Use   Smoking status: Some Days    Types: Cigarettes   Smokeless tobacco: Never  Vaping Use   Vaping Use: Never used  Substance and Sexual Activity   Alcohol use: Not Currently    Comment: rarely   Drug use: Yes    Types: Marijuana   Sexual activity: Not on file  Other Topics Concern   Not on file  Social History Narrative   Not on file   Social Determinants of Health   Financial Resource Strain: Not on file  Food Insecurity: Not on file  Transportation Needs: Not on file  Physical Activity: Not on file  Stress: Not on file  Social Connections: Not on file  Intimate Partner Violence: Not on file    Family History  Problem Relation Age of Onset   Rheum arthritis Mother    Hypertension Mother    Obesity Mother     No past surgical history on file.  ROS: Review of Systems Negative except as stated above  PHYSICAL EXAM: There were no vitals  taken for this visit.  Physical Exam  {male adult master:310786} {male adult master:310785}     Latest Ref Rng & Units 07/13/2021    2:55 PM 06/07/2021    3:25 PM 02/20/2018   11:10 AM  CMP  Glucose 70 - 99 mg/dL 93  409  811   BUN 6 - 20 mg/dL 11  9  9    Creatinine 0.61 - 1.24 mg/dL 9.14  7.82  9.56   Sodium 135 - 145 mmol/L 137  141  139   Potassium 3.5 - 5.1 mmol/L 3.9  4.7  4.5   Chloride 98 - 111 mmol/L 106  106  101   CO2 22 - 32 mmol/L 25  19  22    Calcium 8.9 - 10.3 mg/dL 9.2  9.5  9.6   Total Protein 6.0 - 8.5 g/dL  7.9  7.6   Total Bilirubin 0.0 - 1.2 mg/dL  <2.1  0.3   Alkaline Phos 44 - 121 IU/L  104  97   AST 0 - 40 IU/L  14  16   ALT 0 - 44 IU/L  30  23    Lipid Panel     Component Value Date/Time   CHOL 155 02/20/2018 1110   TRIG 57 02/20/2018 1110  HDL 42 02/20/2018 1110   CHOLHDL 3.7 02/20/2018 1110   LDLCALC 102 02/20/2018 1110    CBC    Component Value Date/Time   WBC 15.0 (H) 07/13/2021 1455   RBC 4.85 07/13/2021 1455   HGB 14.2 07/13/2021 1455   HGB 15.7 06/07/2021 1525   HCT 43.5 07/13/2021 1455   HCT 47.5 06/07/2021 1525   PLT 275 07/13/2021 1455   PLT 302 06/07/2021 1525   MCV 89.7 07/13/2021 1455   MCV 88 06/07/2021 1525   MCH 29.3 07/13/2021 1455   MCHC 32.6 07/13/2021 1455   RDW 14.1 07/13/2021 1455   RDW 13.5 06/07/2021 1525   LYMPHSABS 4.2 (H) 07/13/2021 1455   LYMPHSABS 3.6 (H) 02/20/2018 1110   MONOABS 1.0 07/13/2021 1455   EOSABS 0.4 07/13/2021 1455   EOSABS 0.4 02/20/2018 1110   BASOSABS 0.0 07/13/2021 1455   BASOSABS 0.1 02/20/2018 1110    ASSESSMENT AND PLAN:  There are no diagnoses linked to this encounter.   Patient was given the opportunity to ask questions.  Patient verbalized understanding of the plan and was able to repeat key elements of the plan. Patient was given clear instructions to go to Emergency Department or return to medical center if symptoms don't improve, worsen, or new problems develop.The  patient verbalized understanding.   No orders of the defined types were placed in this encounter.    Requested Prescriptions    No prescriptions requested or ordered in this encounter    No follow-ups on file.  Rema Fendt, NP

## 2022-03-08 ENCOUNTER — Encounter: Payer: Medicaid Other | Admitting: Family

## 2022-03-08 ENCOUNTER — Ambulatory Visit: Payer: Medicaid Other | Admitting: Family

## 2022-03-08 DIAGNOSIS — Z131 Encounter for screening for diabetes mellitus: Secondary | ICD-10-CM

## 2022-03-08 DIAGNOSIS — Z0000 Encounter for general adult medical examination without abnormal findings: Secondary | ICD-10-CM

## 2022-03-08 DIAGNOSIS — Z13228 Encounter for screening for other metabolic disorders: Secondary | ICD-10-CM

## 2022-03-08 DIAGNOSIS — Z1159 Encounter for screening for other viral diseases: Secondary | ICD-10-CM

## 2022-03-08 DIAGNOSIS — Z1329 Encounter for screening for other suspected endocrine disorder: Secondary | ICD-10-CM

## 2022-03-08 DIAGNOSIS — Z114 Encounter for screening for human immunodeficiency virus [HIV]: Secondary | ICD-10-CM

## 2022-03-08 DIAGNOSIS — Z130 Encounter for screening for diseases of the blood and blood-forming organs and certain disorders involving the immune mechanism: Secondary | ICD-10-CM

## 2022-03-08 DIAGNOSIS — Z13220 Encounter for screening for lipoid disorders: Secondary | ICD-10-CM

## 2022-06-13 IMAGING — CT CT ANGIO CHEST
2 of 7 series · 17 of 46 positions shown · IV contrast (APPLIED)
Comparison: 11/10/2021.

CLINICAL DATA: Pulmonary embolism suspected, high probability.
Shortness of breath, possible right hilar mass.

EXAM:
CT ANGIOGRAPHY CHEST WITH CONTRAST
TECHNIQUE: Multidetector CT imaging of the chest was performed using the
standard protocol during bolus administration of intravenous
contrast. Multiplanar CT image reconstructions and MIPs were
obtained to evaluate the vascular anatomy.

[Series 7: thins · axial · 0.98mm/px · z∈[+1262,+1524]mm · 15 of 294 slices shown]
[im 16/294  lung]
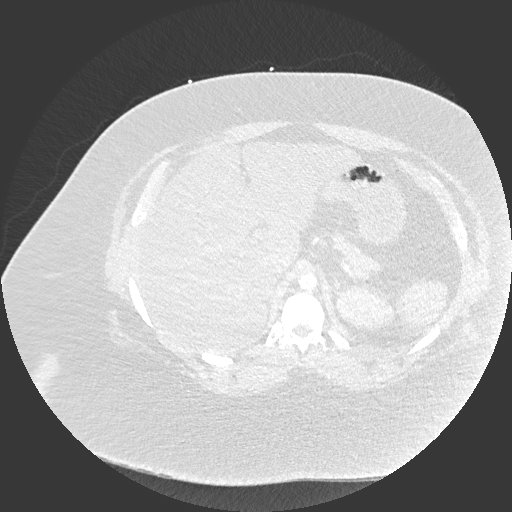
[im 31/294  soft-tissue]
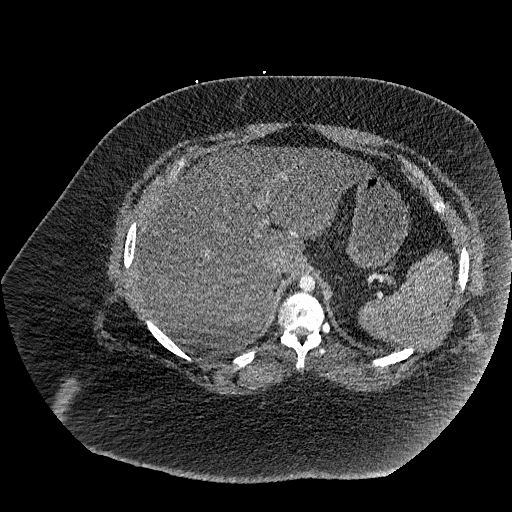
[im 62/294  lung]
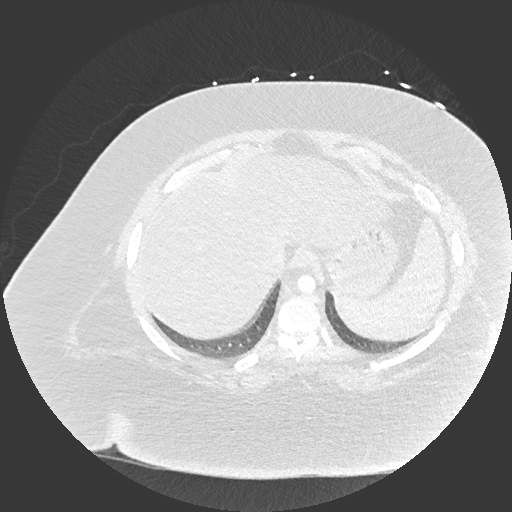
[im 78/294  soft-tissue]
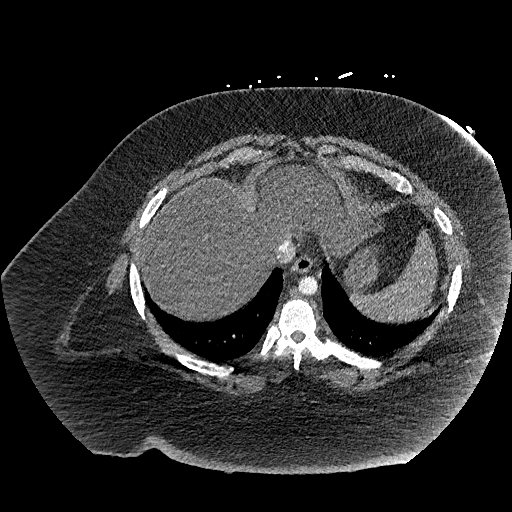
[im 93/294  lung]
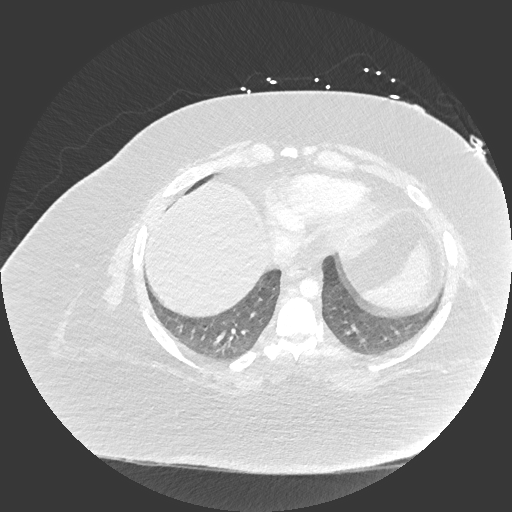
[im 108/294  soft-tissue]
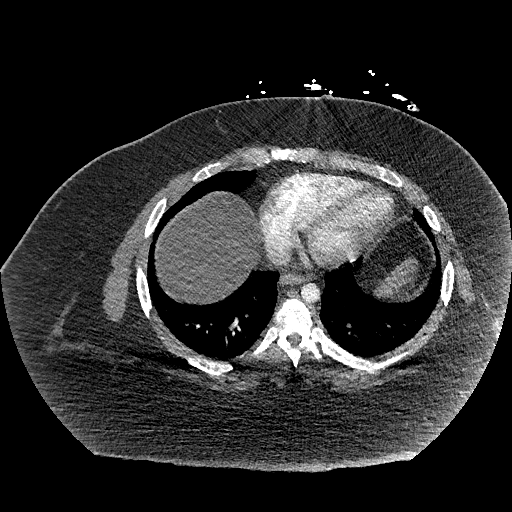
[im 124/294  lung]
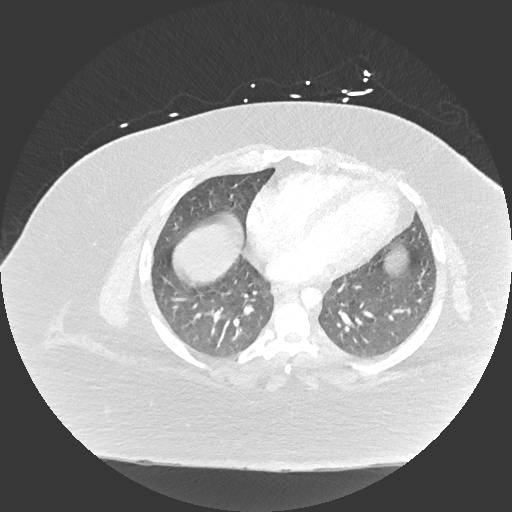
[im 155/294  soft-tissue]
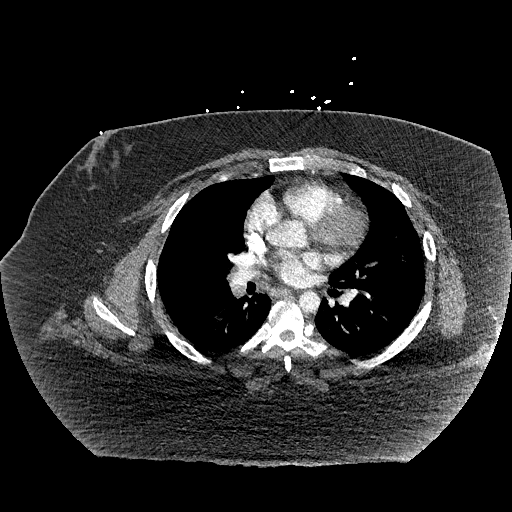
[im 170/294  lung]
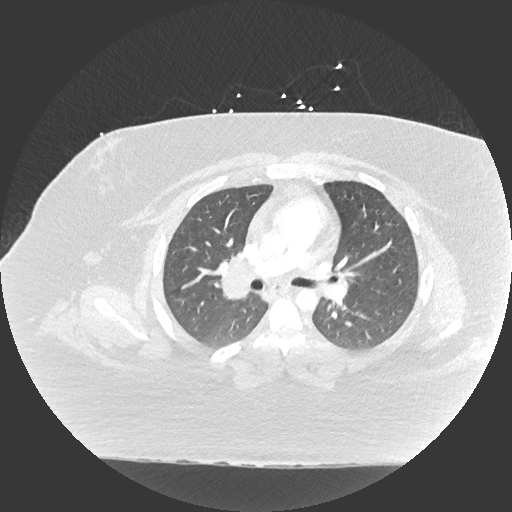
[im 186/294  soft-tissue]
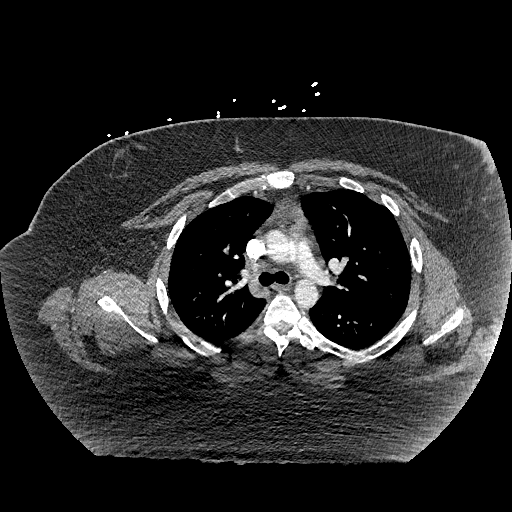
[im 201/294  lung]
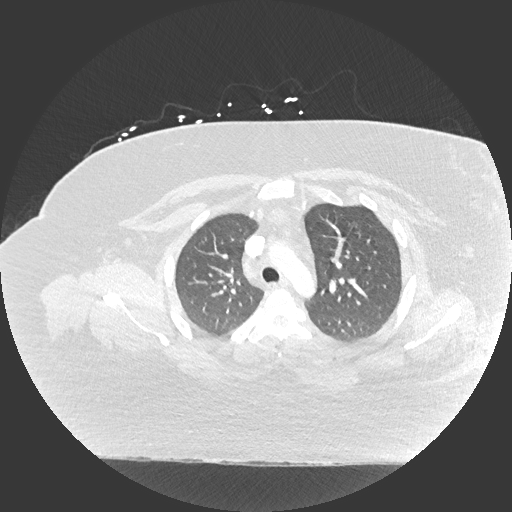
[im 216/294  soft-tissue]
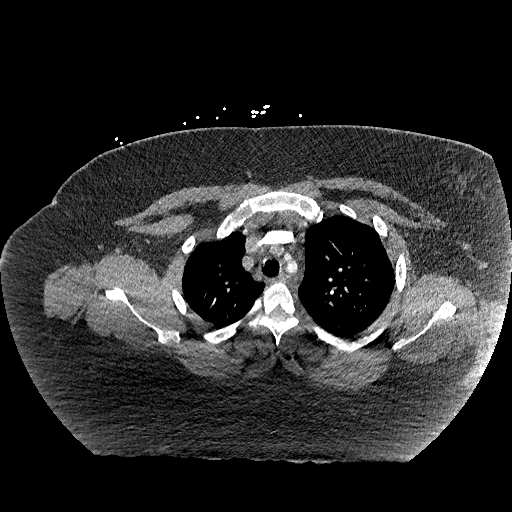
[im 247/294  lung]
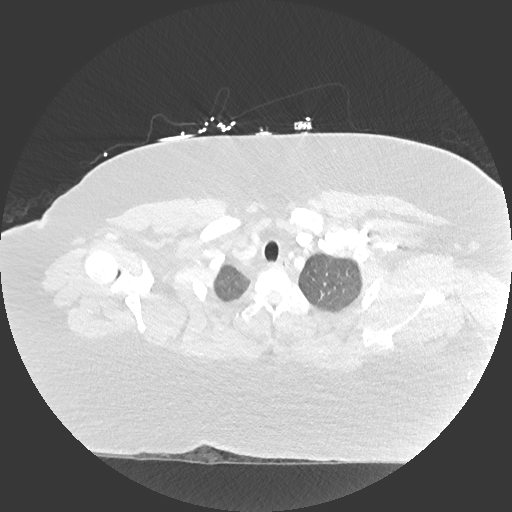
[im 263/294  soft-tissue]
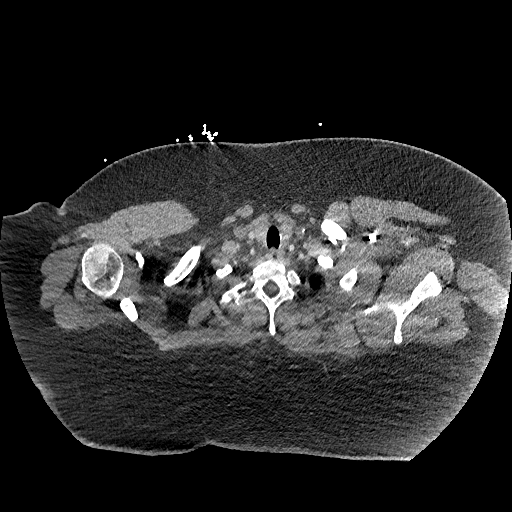
[im 278/294  lung]
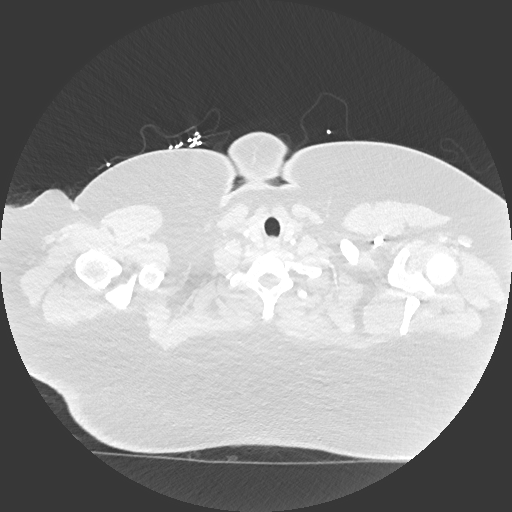

[Series 9: coronal mpr · coronal · 0.59mm/px · 2 of 151 slices shown]
[im 51/151  soft-tissue]
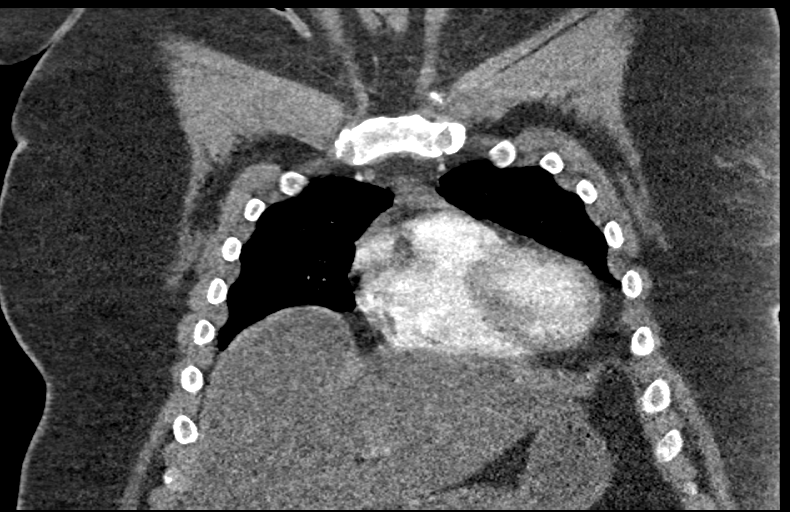
[im 101/151  soft-tissue]
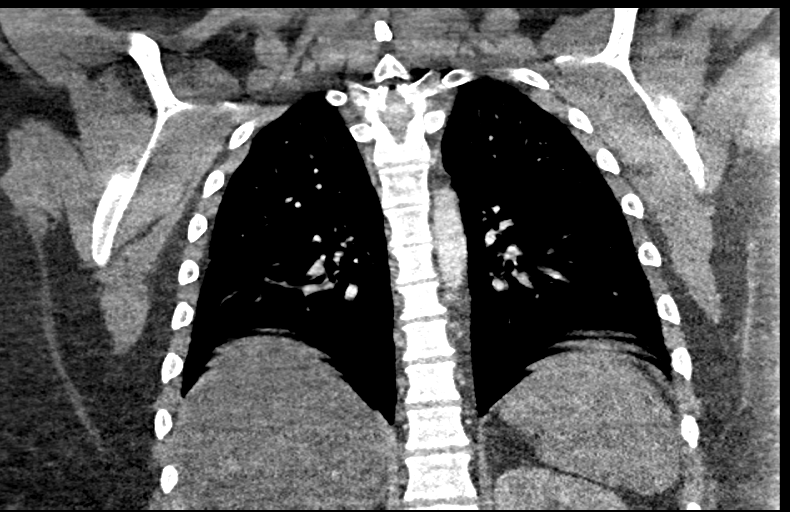

[17 of 46 positions shown; findings below may reference images not displayed]

RADIATION DOSE REDUCTION: This exam was performed according to the
departmental dose-optimization program which includes automated
exposure control, adjustment of the mA and/or kV according to
patient size and/or use of iterative reconstruction technique.

CONTRAST:  100mL OMNIPAQUE IOHEXOL 350 MG/ML SOLN
FINDINGS: Cardiovascular: The heart is normal in size and there is no
pericardial effusion. The aorta and pulmonary trunk are normal in
caliber. No large central pulmonary artery filling defect is
identified. Evaluation of the segmental and subsegmental arteries is
limited due to suboptimal opacification and respiratory motion
artifact.

Mediastinum/Nodes: A few prominent lymph nodes are seen in the
mediastinum measuring up to 1.5 cm in short axis diameter in the
right paratracheal space. There is a mass like lesion at the right
hilum measuring 3.9 x 3.2 cm. No hilar lymph adenopathy on the left.
No axillary lymphadenopathy. The thyroid gland, trachea, and
esophagus are within normal limits.

Lungs/Pleura: Lungs are clear. No pleural effusion or pneumothorax.

Upper Abdomen: There is fatty infiltration of the liver. The spleen
is enlarged measuring 14.1 cm.

Musculoskeletal: Gynecomastia is present bilaterally. No acute or
suspicious osseous abnormality.

Review of the MIP images confirms the above findings.
IMPRESSION: 1. No large central pulmonary artery filling defect. Evaluation of
the segmental and subsegmental arteries is limited due to suboptimal
opacification and respiratory motion artifact. If clinical concern
for pulmonary embolism persists, short-term repeat evaluation or V/Q
scan is suggested.
2. No acute process in the chest.
3. Masslike lesion at the right hilum measuring 3.9 x 3.2 cm.
Differential diagnosis includes conglomeration of enlarged lymph
nodes versus other benign or malignant process. PET-CT or
bronchoscopy with biopsy may be beneficial for further evaluation.
4. Hepatomegaly.
5. Mild splenomegaly.

## 2022-07-21 ENCOUNTER — Ambulatory Visit (INDEPENDENT_AMBULATORY_CARE_PROVIDER_SITE_OTHER): Payer: Medicaid Other | Admitting: Family Medicine

## 2022-07-21 VITALS — BP 137/84 | HR 99 | Temp 97.1°F | Resp 16 | Ht 66.0 in | Wt >= 6400 oz

## 2022-07-21 DIAGNOSIS — R911 Solitary pulmonary nodule: Secondary | ICD-10-CM

## 2022-07-21 DIAGNOSIS — R918 Other nonspecific abnormal finding of lung field: Secondary | ICD-10-CM

## 2022-07-21 DIAGNOSIS — R591 Generalized enlarged lymph nodes: Secondary | ICD-10-CM | POA: Diagnosis not present

## 2022-07-27 ENCOUNTER — Encounter: Payer: Self-pay | Admitting: Family Medicine

## 2022-07-27 NOTE — Progress Notes (Signed)
   New Patient Office Visit  Subjective    Patient ID: Trevor Wilcox, male    DOB: 2000-04-22  Age: 22 y.o. MRN: [id number]  CC:  Chief Complaint  Patient presents with   Dental Problem   Cyst    HPI Trevor Wilcox presents to establish care with a new provider as well as complaint of increasing knot on his neck.    Outpatient Encounter Medications as of 07/21/2022  Medication Sig   albuterol (VENTOLIN HFA) 108 (90 Base) MCG/ACT inhaler Inhale 1-2 puffs into the lungs every 6 (six) hours as needed for wheezing or shortness of breath.   No facility-administered encounter medications on file as of 07/21/2022.    Past Medical History:  Diagnosis Date   Obesity     No past surgical history on file.  Family History  Problem Relation Age of Onset   Rheum arthritis Mother    Hypertension Mother    Obesity Mother     Social History   Socioeconomic History   Marital status: Single    Spouse name: Not on file   Number of children: Not on file   Years of education: Not on file   Highest education level: Not on file  Occupational History   Not on file  Tobacco Use   Smoking status: Some Days    Types: Cigarettes   Smokeless tobacco: Never  Vaping Use   Vaping Use: Never used  Substance and Sexual Activity   Alcohol use: Not Currently    Comment: rarely   Drug use: Yes    Types: Marijuana   Sexual activity: Not on file  Other Topics Concern   Not on file  Social History Narrative   Not on file   Social Determinants of Health   Financial Resource Strain: Not on file  Food Insecurity: Not on file  Transportation Needs: Not on file  Physical Activity: Not on file  Stress: Not on file  Social Connections: Not on file  Intimate Partner Violence: Not on file    Review of Systems  All other systems reviewed and are negative.       Objective    BP 137/84   Pulse 99   Temp (!) 97.1 F (36.2 C) (Oral)   Resp 16   Ht 5\' 6"  (1.676 m)   Wt (!) 448 lb (203.2  kg)   SpO2 93%   BMI 72.31 kg/m   Physical Exam Vitals and nursing note reviewed.  Constitutional:      General: He is not in acute distress.    Appearance: He is obese.  Cardiovascular:     Rate and Rhythm: Normal rate and regular rhythm.  Pulmonary:     Effort: Pulmonary effort is normal.     Breath sounds: Normal breath sounds.  Abdominal:     Palpations: Abdomen is soft.     Tenderness: There is no abdominal tenderness.  Lymphadenopathy:     Cervical: No cervical adenopathy.  Neurological:     General: No focal deficit present.     Mental Status: He is alert and oriented to person, place, and time.         Assessment & Plan:    1. Abnormal CT lung screening Referral for PET CT as recommended greater than 1 year ago 2/2 abnormal CT  2. Lymphadenopathy ?2/2 above    Return if symptoms worsen or fail to improve.   Tommie Raymond, MD
